# Patient Record
Sex: Male | Born: 1980 | Hispanic: Yes | Marital: Single | Smoking: Never smoker
Health system: Southern US, Community
[De-identification: ages and names within clinical notes are randomized; demographics above are authoritative.]

## PROBLEM LIST (undated history)

## (undated) DIAGNOSIS — E119 Type 2 diabetes mellitus without complications: Secondary | ICD-10-CM

---

## 2019-05-14 ENCOUNTER — Encounter: Payer: Self-pay | Admitting: Emergency Medicine

## 2019-05-14 ENCOUNTER — Emergency Department
Admission: EM | Admit: 2019-05-14 | Discharge: 2019-05-14 | Disposition: A | Discharge status: Home / Self Care | Payer: BLUE CROSS/BLUE SHIELD | Attending: Emergency Medicine | Admitting: Emergency Medicine

## 2019-05-14 ENCOUNTER — Emergency Department: Payer: BLUE CROSS/BLUE SHIELD

## 2019-05-14 DIAGNOSIS — R079 Chest pain, unspecified: Secondary | ICD-10-CM | POA: Diagnosis present

## 2019-05-14 DIAGNOSIS — E119 Type 2 diabetes mellitus without complications: Secondary | ICD-10-CM | POA: Diagnosis not present

## 2019-05-14 DIAGNOSIS — R0789 Other chest pain: Secondary | ICD-10-CM | POA: Diagnosis not present

## 2019-05-14 HISTORY — DX: Type 2 diabetes mellitus without complications: E11.9

## 2019-05-14 LAB — CBC
HCT: 49.6 % (ref 39.0–52.0)
Hemoglobin: 17.7 g/dL — ABNORMAL HIGH (ref 13.0–17.0)
MCH: 30.1 pg (ref 26.0–34.0)
MCHC: 35.7 g/dL (ref 30.0–36.0)
MCV: 84.2 fL (ref 80.0–100.0)
Platelets: 249 10*3/uL (ref 150–400)
RBC: 5.89 MIL/uL — ABNORMAL HIGH (ref 4.22–5.81)
RDW: 11.9 % (ref 11.5–15.5)
WBC: 6.8 10*3/uL (ref 4.0–10.5)
nRBC: 0 % (ref 0.0–0.2)

## 2019-05-14 LAB — BASIC METABOLIC PANEL
Anion gap: 11 (ref 5–15)
BUN: 15 mg/dL (ref 6–20)
CO2: 30 mmol/L (ref 22–32)
Calcium: 9.9 mg/dL (ref 8.9–10.3)
Chloride: 95 mmol/L — ABNORMAL LOW (ref 98–111)
Creatinine, Ser: 0.93 mg/dL (ref 0.61–1.24)
GFR calc Af Amer: >60 mL/min (ref >60)
GFR calc non Af Amer: >60 mL/min (ref >60)
Glucose, Bld: 281 mg/dL — ABNORMAL HIGH (ref 70–99)
Potassium: 3.7 mmol/L (ref 3.5–5.1)
Sodium: 136 mmol/L (ref 135–145)

## 2019-05-14 LAB — TROPONIN I (HIGH SENSITIVITY)
Troponin I (High Sensitivity): <2 ng/L (ref <18)
Troponin I (High Sensitivity): 3 ng/L (ref <18)

## 2019-05-14 MED ORDER — LIDOCAINE VISCOUS HCL 2 % MT SOLN
15.0000 mL | Freq: Once | OROMUCOSAL | Status: AC
  Administered 2019-05-14: 15 mL via ORAL
  Filled 2019-05-14: qty 15

## 2019-05-14 MED ORDER — FAMOTIDINE 20 MG PO TABS: 20.0000 mg | ORAL_TABLET | Freq: Every day | ORAL | 0 refills | Status: AC

## 2019-05-14 MED ORDER — SODIUM CHLORIDE 0.9% FLUSH
3.0000 mL | Freq: Once | INTRAVENOUS | Status: AC
  Administered 2019-05-14: 3 mL via INTRAVENOUS

## 2019-05-14 MED ORDER — OMEPRAZOLE 20 MG PO CPDR: 20.0000 mg | DELAYED_RELEASE_CAPSULE | Freq: Every day | ORAL | 0 refills | Status: AC

## 2019-05-14 MED ORDER — NAPROXEN 375 MG PO TBEC: 375.0000 mg | DELAYED_RELEASE_TABLET | Freq: Two times a day (BID) | ORAL | 0 refills | Status: AC

## 2019-05-14 MED ORDER — ALUM & MAG HYDROXIDE-SIMETH 200-200-20 MG/5ML PO SUSP
30.0000 mL | Freq: Once | ORAL | Status: AC
  Administered 2019-05-14: 30 mL via ORAL
  Filled 2019-05-14: qty 30

## 2019-05-14 NOTE — ED Provider Notes (Signed)
[facility] Emergency Department Provider Note  ____________________________________________   First MD Initiated Contact with Patient 05/14/19 1623     (approximate)  I have reviewed the triage vital signs and the nursing notes.   HISTORY  Chief Complaint Chest Pain    HPI Walter Mejia is a 38 y.o. male with past medical history of diabetes here with chest pain.   The patient states that his symptoms started as mild burning substernal pain while he was working.  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  He states it is not necessarily exertional.  He states that pain began as a sensation of pressure in his epigastric area and radiated up towards his left chest and left shoulder blade.  He had some mild nausea and indigestion with it.  He states he did break out in a sweat as well.  He had no shortness of breath.  He states that symptoms resolved as he stopped working.  He ate and felt somewhat better.  Denies known history of ulcers.  No history of coronary disease.  Specifically, denies any history of high blood pressure, high cholesterol, or early family history of heart disease.  He does not smoke.  Denies any other acute complaints.  Pain is currently 4 out of 10 and localized primarily in the substernal area.  Does not radiate to the right side. HPI: A 38 year old patient presents for evaluation of chest pain. Initial onset of pain was more than 6 hours ago. The patient's chest pain is described as heaviness/pressure/tightness and is not worse with exertion. The patient complains of nausea. The patient's chest pain is middle- or left-sided, is not well-localized, is not sharp and does radiate to the arms/jaw/neck. The patient denies diaphoresis. The patient has smoked in the past 90 days. The patient has no history of stroke, has no history of peripheral artery disease, denies any history of treated diabetes, has no relevant family history of  coronary artery disease (first degree relative at less than age 38), is not hypertensive, has no history of hypercholesterolemia and does not have an elevated BMI (>=30).      Past Medical History:  Diagnosis Date   Diabetes mellitus without complication (HCC)     There are no active problems to display for this patient.   History reviewed. No pertinent surgical history.  Prior to Admission medications   Medication Sig Start Date End Date Taking? Authorizing Provider  famotidine (PEPCID) 20 MG tablet Take 1 tablet (20 mg total) by mouth daily for 10 days. 05/14/19 05/24/19  Shaune PollackIsaacs, Trenna Kiely, MD  naproxen 375 MG TBEC Take 1 tablet (375 mg total) by mouth 2 (two) times daily with a meal for 7 days. 05/14/19 05/21/19  Shaune PollackIsaacs, Amonie Wisser, MD  omeprazole (PRILOSEC) 20 MG capsule Take 1 capsule (20 mg total) by mouth daily for 10 days. 05/14/19 05/24/19  Shaune PollackIsaacs, Valerya Maxton, MD    Allergies Patient has no known allergies.  No family history on file.  Social History Social History   Tobacco Use   Smoking status: Never Smoker   Smokeless tobacco: Never Used  Substance Use Topics   Alcohol use: Not on file   Drug use: Not on file    Review of Systems  Review of Systems  Constitutional: Positive for fatigue. Negative for chills and fever.  HENT: Negative for sore throat.   Respiratory: Negative for shortness of breath.   Cardiovascular: Positive for chest pain.  Gastrointestinal: Positive for  nausea. Negative for abdominal pain.  Genitourinary: Negative for flank pain.  Musculoskeletal: Negative for neck pain.  Skin: Negative for rash and wound.  Allergic/Immunologic: Negative for immunocompromised state.  Neurological: Negative for weakness and numbness.  Hematological: Does not bruise/bleed easily.     ____________________________________________  PHYSICAL EXAM:      VITAL SIGNS: ED Triage Vitals  Enc Vitals Group     BP 05/14/19 1430 (!) 135/98     Pulse Rate 05/14/19 1430 95      Resp 05/14/19 1430 16     Temp 05/14/19 1430 98.2 F (36.8 C)     Temp Source 05/14/19 1430 Oral     SpO2 05/14/19 1430 98 %     Weight 05/14/19 1427 210 lb (95.3 kg)     Height 05/14/19 1427 5\' 9"  (1.753 m)     Head Circumference --      Peak Flow --      Pain Score 05/14/19 1427 6     Pain Loc --      Pain Edu? --      Excl. in [place]? --      Physical Exam Vitals signs and nursing note reviewed.  Constitutional:      General: He is not in acute distress.    Appearance: He is well-developed.  HENT:     Head: Normocephalic and atraumatic.  Eyes:     Conjunctiva/sclera: Conjunctivae normal.  Neck:     Musculoskeletal: Neck supple.  Cardiovascular:     Rate and Rhythm: Normal rate and regular rhythm.     Heart sounds: Normal heart sounds. No murmur. No friction rub.  Pulmonary:     Effort: Pulmonary effort is normal. No respiratory distress.     Breath sounds: Normal breath sounds. No wheezing or rales.  Abdominal:     General: There is no distension.     Palpations: Abdomen is soft.     Tenderness: There is no abdominal tenderness.  Skin:    General: Skin is warm.     Capillary Refill: Capillary refill takes less than 2 seconds.  Neurological:     Mental Status: He is alert and oriented to person, place, and time.     Motor: No abnormal muscle tone.       ____________________________________________   LABS (all labs ordered are listed, but only abnormal results are displayed)  Labs Reviewed  BASIC METABOLIC PANEL - Abnormal; Notable for the following components:      Result Value   Chloride 95 (*)    Glucose, Bld 281 (*)    All other components within normal limits  CBC - Abnormal; Notable for the following components:   RBC 5.89 (*)    Hemoglobin 17.7 (*)    All other components within normal limits  TROPONIN I (HIGH SENSITIVITY)  TROPONIN I (HIGH SENSITIVITY)    ____________________________________________  EKG:  Normal sinus rhythm, ventricular  rate 96.  Normal EKG.  PR 164, QRS 82, QTc 439.  No ischemic changes. ________________________________________  RADIOLOGY All imaging, including plain films, CT scans, and ultrasounds, independently reviewed by me, and interpretations confirmed via formal radiology reads.  ED MD interpretation:   CXR: Negative  Official radiology report(s): Dg Chest 2 View  Result Date: 05/14/2019 CLINICAL DATA:  Chest pain, shortness of breath EXAM: CHEST - 2 VIEW COMPARISON:  None. FINDINGS: The heart size and mediastinal contours are within normal limits. Both lungs are clear. The visualized skeletal structures are unremarkable. IMPRESSION: No acute  abnormality of the lungs.  No focal airspace opacity. Electronically Signed   By: Lauralyn PrimesAlex  Bibbey M.D.   On: 05/14/2019 16:55    ____________________________________________  PROCEDURES   Procedure(s) performed (including Critical Care):  Procedures  ____________________________________________  INITIAL IMPRESSION / MDM / ASSESSMENT AND PLAN / ED COURSE  As part of my medical decision making, I reviewed the following data within the electronic MEDICAL RECORD NUMBER Notes from prior ED visits and [place] Controlled Substance Database  HEAR Score: 3   *Walter HobbyMiguel Mejia was evaluated in Emergency Department on 05/14/2019 for the symptoms described in the history of present illness. He was evaluated in the context of the global COVID-19 pandemic, which necessitated consideration that the patient might be at risk for infection with the SARS-CoV-2 virus that causes COVID-19. Institutional protocols and algorithms that pertain to the evaluation of patients at risk for COVID-19 are in a state of rapid change based on information released by regulatory bodies including the CDC and federal and state organizations. These policies and algorithms were followed during the patient's care in the ED.  Some ED evaluations and interventions may be delayed as a result of limited staffing  during the pandemic.*      Medical Decision Making: 38 yo M with h/o DM here with atypical CP. HEART score is 3, with trop neg x 2 and delta of <5 with hsTnI - per HEART pathway, low suspicion for ACS at this time. DDx includes gastritis/GERD given resolution with GI cocktail. Lungs CTAB, normal WOB, do not suspect PNA, PE, dissection. Pain may also be MSK in etiology as it seems correlated with using his arms to operate machinery @ work. Will treat with NSAIDs and antacids for possible GI, MSK pain and refer for outpt follow-up. Return precautions given.  ____________________________________________  FINAL CLINICAL IMPRESSION(S) / ED DIAGNOSES  Final diagnoses:  Atypical chest pain     MEDICATIONS GIVEN DURING THIS VISIT:  Medications  sodium chloride flush (NS) 0.9 % injection 3 mL (3 mLs Intravenous Given 05/14/19 1641)  alum & mag hydroxide-simeth (MAALOX/MYLANTA) 200-200-20 MG/5ML suspension 30 mL (30 mLs Oral Given 05/14/19 1809)    And  lidocaine (XYLOCAINE) 2 % viscous mouth solution 15 mL (15 mLs Oral Given 05/14/19 1809)     ED Discharge Orders         Ordered    naproxen 375 MG TBEC  2 times daily with meals     05/14/19 1956    omeprazole (PRILOSEC) 20 MG capsule  Daily     05/14/19 1956    famotidine (PEPCID) 20 MG tablet  Daily     05/14/19 1956           Note:  This document was prepared using Dragon voice recognition software and may include unintentional dictation errors.   Shaune PollackIsaacs, Halo Shevlin, MD 05/14/19 2111

## 2019-05-14 NOTE — Discharge Instructions (Signed)
Take the prescribed medications daily for the next 7 to 10 days.  These should help your pain.  I recommend taking the next 2 days off of work and I provided you with a work note.

## 2019-05-14 NOTE — ED Notes (Signed)
Md at bedside ith interpreter. awaiting disposition.

## 2019-05-14 NOTE — ED Notes (Signed)
Po meds given. Patient states chest pain continues comes and goes, describes  pain like tightness

## 2019-05-14 NOTE — ED Triage Notes (Signed)
C/O Chest pain and SOB intermittently x 1 day.

## 2019-05-14 NOTE — ED Notes (Signed)
Patient reports "pain in chest has subsided somewhat" . vss awaiting disposition.

## 2019-05-14 NOTE — ED Notes (Signed)
Patient reports ACWP that began few days ago, pain radiating into left arm, continues to have same type pain today. Reports pain comes and goes. Not constant.

## 2020-09-26 IMAGING — CR CHEST - 2 VIEW
2 series · 2 of 2 positions shown · non-contrast
Comparison: None.

CLINICAL DATA: Chest pain, shortness of breath

EXAM:
CHEST - 2 VIEW

[chest pa]
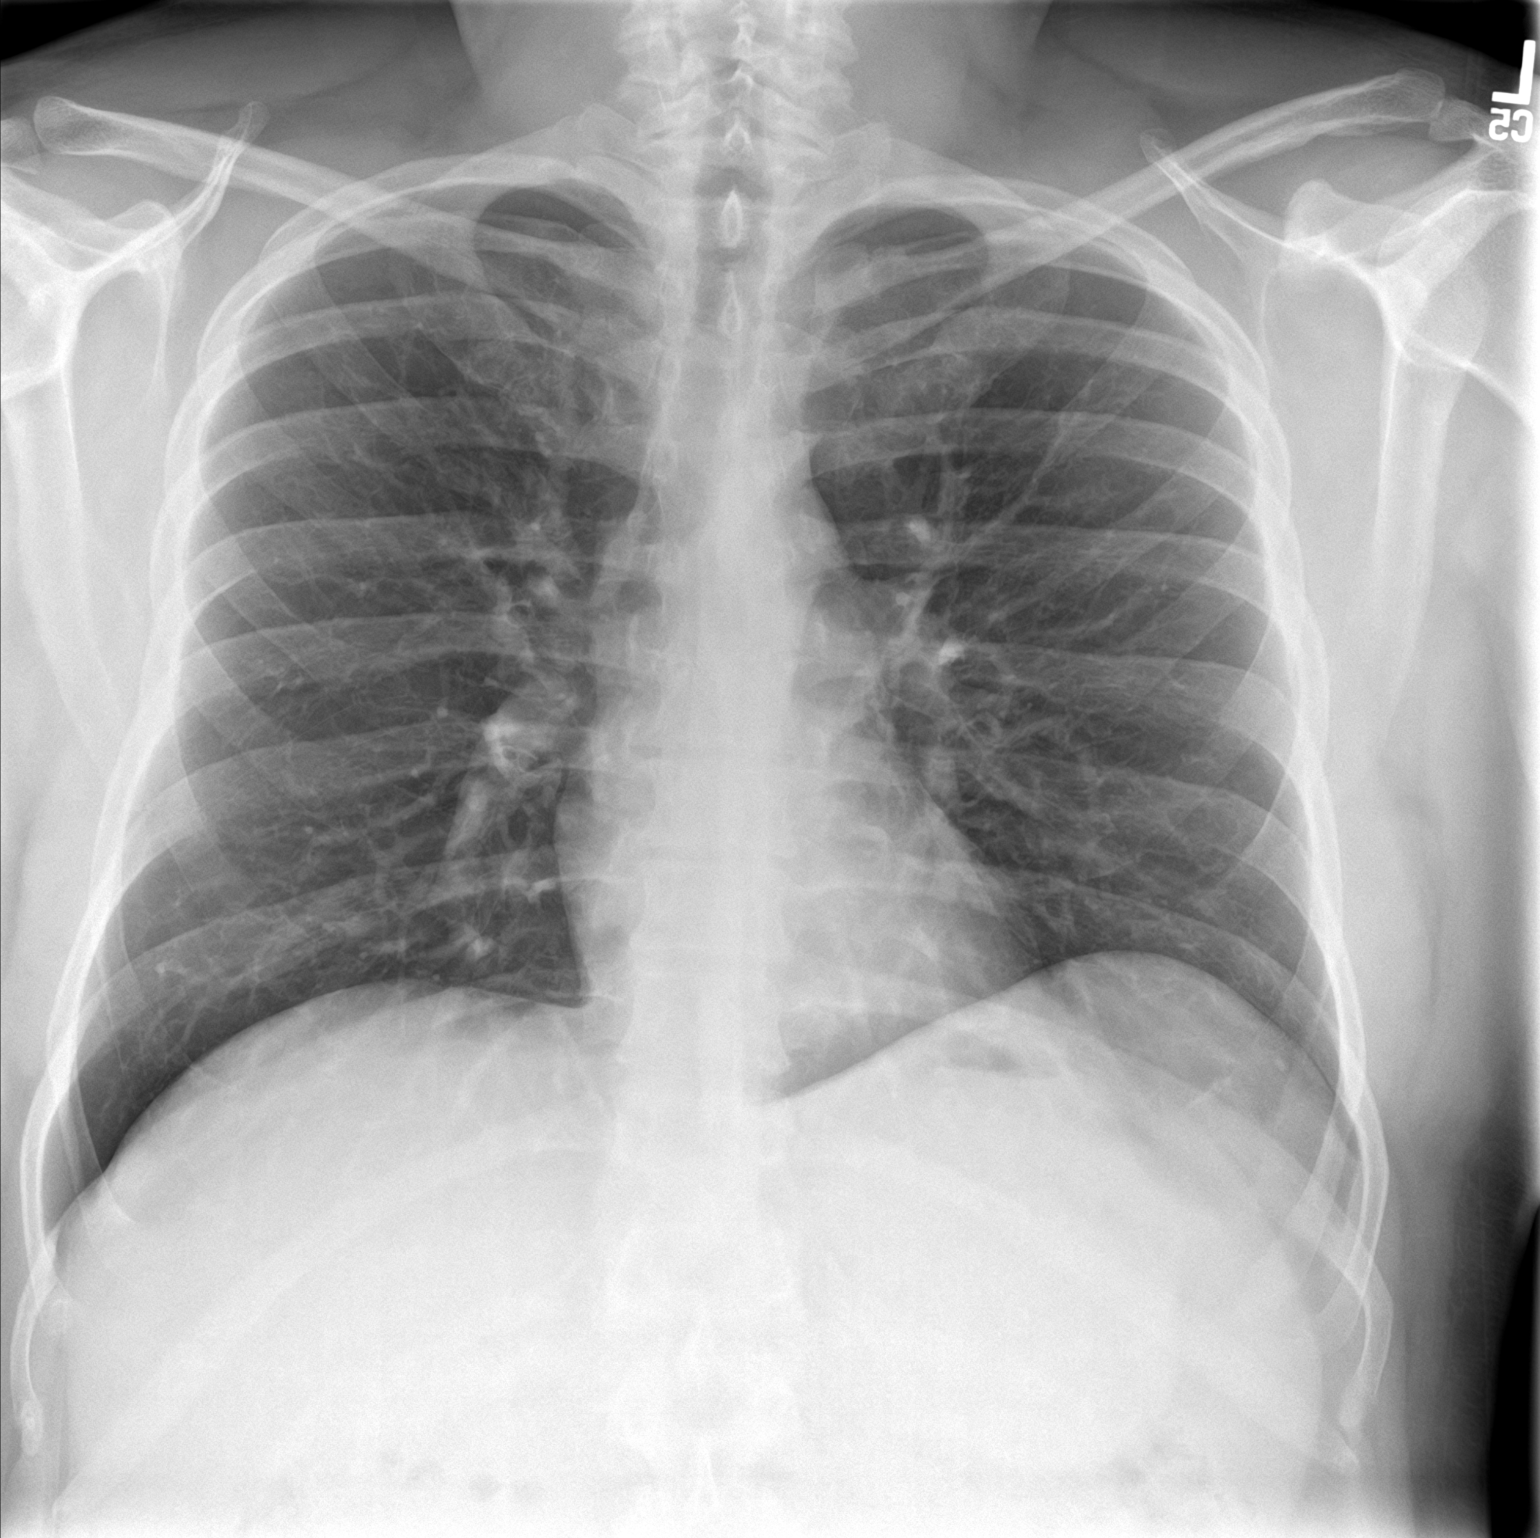

[chest lat]
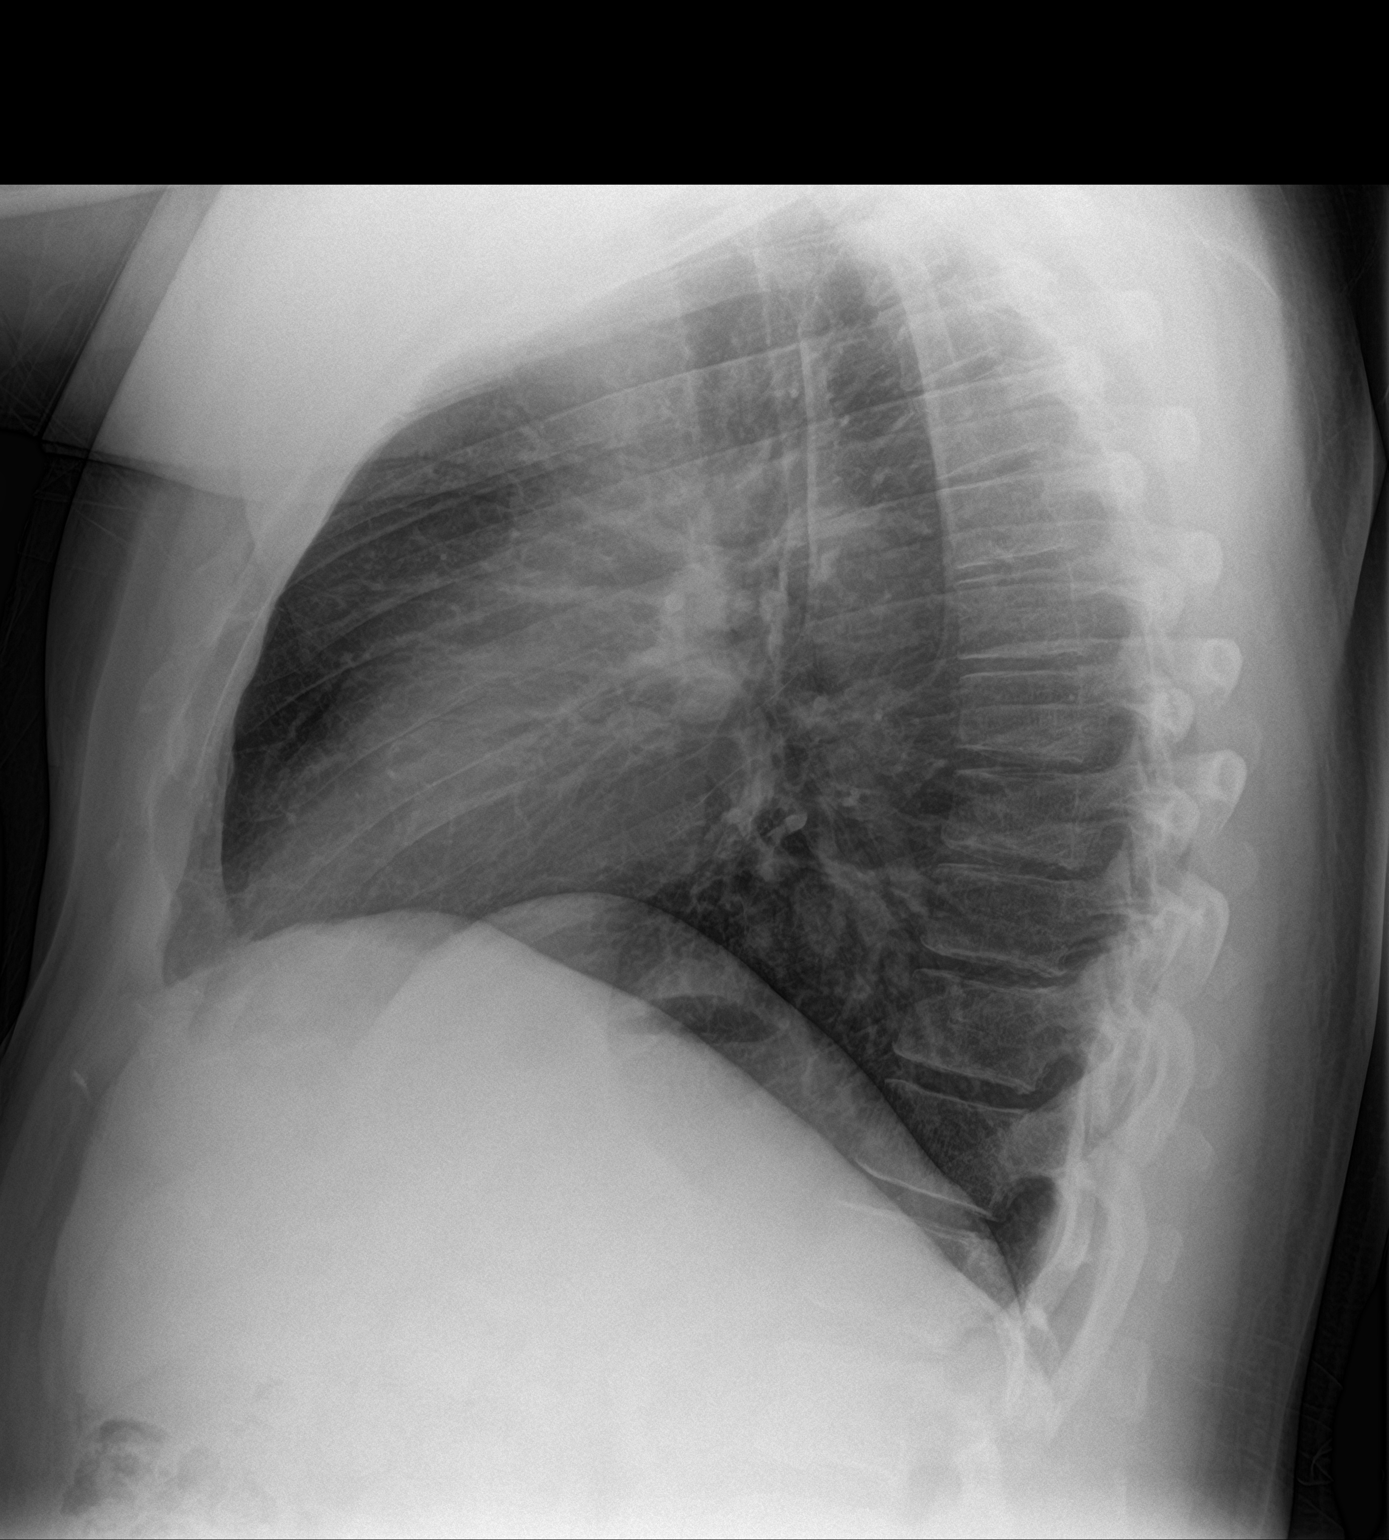

[2 of 2 positions shown; findings below may reference images not displayed]

FINDINGS: The heart size and mediastinal contours are within normal limits.
Both lungs are clear. The visualized skeletal structures are
unremarkable.
IMPRESSION: No acute abnormality of the lungs.  No focal airspace opacity.
